# Patient Record
Sex: Male | Born: 1990 | Race: Black or African American | Hispanic: No | Marital: Single | Smoking: Never smoker
Health system: Southern US, Community
[De-identification: ages and names within clinical notes are randomized; demographics above are authoritative.]

## PROBLEM LIST (undated history)

## (undated) DIAGNOSIS — T7840XA Allergy, unspecified, initial encounter: Secondary | ICD-10-CM

## (undated) HISTORY — DX: Allergy, unspecified, initial encounter: T78.40XA

## (undated) HISTORY — PX: FRACTURE SURGERY: SHX138

## (undated) HISTORY — PX: HERNIA REPAIR: SHX51

---
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
## 2009-05-23 ENCOUNTER — Ambulatory Visit (HOSPITAL_BASED_OUTPATIENT_CLINIC_OR_DEPARTMENT_OTHER): Admission: RE | Admit: 2009-05-23 | Discharge: 2009-05-23 | Payer: Self-pay | Admitting: Orthopedic Surgery

## 2010-03-23 LAB — POCT HEMOGLOBIN-HEMACUE: Hemoglobin: 15.1 g/dL (ref 13.0–17.0)

## 2010-04-06 LAB — POCT HEMOGLOBIN-HEMACUE: Hemoglobin: 14.7 g/dL (ref 13.0–17.0)

## 2010-05-22 NOTE — Op Note (Signed)
NAMEHUGO, LYBRAND NO.:  [id number]   MEDICAL RECORD NO.:  [id number]          PATIENT TYPE:  AMB   LOCATION:  DSC                          FACILITY:  [facility]   PHYSICIAN:  Hermelinda Medicus, M.D.   DATE OF BIRTH:  [birth year]   DATE OF PROCEDURE:  07/21/2004  DATE OF DISCHARGE:                                 OPERATIVE REPORT   PREOPERATIVE DIAGNOSIS:  Nasal fracture playing basketball of 4 days  duration with septal and nasal deviation to the right.   POSTOPERATIVE DIAGNOSIS:  Nasal fracture playing basketball of 4 days  duration with septal and nasal deviation to the right.   OPERATION:  Closed reduction of nasal fracture.   ANESTHESIA:  General endotracheal anesthesia.   SURGEON:  Dr. Hermelinda Medicus.   ANESTHESIOLOGIST:  Dr. Laural Benes.   DESCRIPTION OF PROCEDURE:  The patient was placed in the supine position.  Under general endotracheal anesthesia, was prepped and draped in the  appropriate manner using the usual Hibiclens and the usual head drape.  Topical cocaine was used to anesthetize the nose and shrink the membranes.  The nasal bones were then brought back to the midline using the Ash forceps  and external pressure, and then the septum was brought back to the midline  as well using the Ash forceps. Once this was completed, a Merocel pack was  placed in the right side and a nasal cast was placed externally. The patient  tolerated the procedure well and is doing well postoperatively. The  patient's father is a Psychologist, occupational of their basketball team and he apparently is  going [place] to try to play basketball in the future weeks and he is  advised that he should not  expose him to playing basketball for the next 6  weeks because of the trauma to, and the weakness of his nose. However, they  wish to place, so he will play with a nose guard. The patient will return  tomorrow for removal of the packing and then may have the cast removed on  Friday. His  further followup will be in two weeks, four weeks, and then six  weeks.       JC/MEDQ  D:  07/21/2004  T:  07/21/2004  Job:  [id number]   cc:   Theador Hawthorne, M.D.  [phone]  [place]  [place] [address]  Fax: [phone]

## 2010-05-22 NOTE — H&P (Signed)
[name], [name] NO.:  [id number]   MEDICAL RECORD NO.:  [id number]          PATIENT TYPE:  AMB   LOCATION:  DSC                          FACILITY:  [facility]   PHYSICIAN:  Hermelinda Medicus, M.D.   DATE OF BIRTH:  20-Feb-1990   DATE OF ADMISSION:  07/21/2004  DATE OF DISCHARGE:                                HISTORY & PHYSICAL   This patient is a 20 year old male who was playing basketball and suffered a  fractured nose when hit with an elbow.  He has a deviation of his nose as  well as his septum and now enters in the fourth day post trauma for closed  reduction, nasal fracture.   His past history is just taking Allegra D for allergies.  He also has had a  penny removed from his esophagus apparently at age 52, and otherwise he is in  excellent condition.  He does wear contacts and glasses, and he has no other  symptoms or past problems.   He is allergic to SULFA, rash, and LIDOCAINE apparently caused a slight rash  also.  He is lactose-intolerant.   PHYSICAL EXAMINATION:  VITAL SIGNS:  Blood pressure of 110/75, pulse of 50,  weight 135, temperature 96.8.  HEENT:  Ears are clear.  Tympanic membranes are clear.  Nose is obviously  pushed over to the right, and the septum is pushed over to the right as  well.  The nasal airway is blocked on that right side.  Oral cavity is  clear.  Larynx is clear.  True cords, false cords, epiglottis, base of  tongue are clear of ulceration or mass.  True cord mobility, gag reflex,  ___________, facial nerve are all symmetrical.  NECK:  Free of any thyromegaly, cervical adenopathy or mass.  CHEST:  Clear.  No rales, rhonchi or wheezes.  CARDIOVASCULAR:  No ____________, snaps, murmurs, or gallops.  ABDOMEN:  Unremarkable.  EXTREMITIES:  Unremarkable.   INITIAL DIAGNOSIS:  Nasal fracture with deviation.       JC/MEDQ  D:  07/21/2004  T:  07/21/2004  Job:  [id number]   cc:   Theador Hawthorne, M.D.  [phone]   [place]  [place] [address]  Fax: [phone]

## 2010-09-15 ENCOUNTER — Emergency Department (HOSPITAL_COMMUNITY)
Admission: EM | Admit: 2010-09-15 | Discharge: 2010-09-15 | Disposition: A | Discharge status: Home / Self Care | Payer: 59 | Attending: Emergency Medicine | Admitting: Emergency Medicine

## 2010-09-15 DIAGNOSIS — R05 Cough: Secondary | ICD-10-CM | POA: Insufficient documentation

## 2010-09-15 DIAGNOSIS — R059 Cough, unspecified: Secondary | ICD-10-CM | POA: Insufficient documentation

## 2010-09-15 DIAGNOSIS — R0602 Shortness of breath: Secondary | ICD-10-CM | POA: Insufficient documentation

## 2010-09-15 DIAGNOSIS — J45909 Unspecified asthma, uncomplicated: Secondary | ICD-10-CM | POA: Insufficient documentation

## 2010-09-15 DIAGNOSIS — J3489 Other specified disorders of nose and nasal sinuses: Secondary | ICD-10-CM | POA: Insufficient documentation

## 2013-05-26 ENCOUNTER — Ambulatory Visit: Payer: 59

## 2013-05-26 ENCOUNTER — Ambulatory Visit: Payer: 59 | Admitting: Emergency Medicine

## 2013-05-26 VITALS — BP 124/78 | HR 73 | Temp 97.5°F | Resp 16 | Ht 72.0 in | Wt 184.0 lb

## 2013-05-26 DIAGNOSIS — S92009A Unspecified fracture of unspecified calcaneus, initial encounter for closed fracture: Secondary | ICD-10-CM

## 2013-05-26 DIAGNOSIS — M79673 Pain in unspecified foot: Secondary | ICD-10-CM

## 2013-05-26 DIAGNOSIS — M79609 Pain in unspecified limb: Secondary | ICD-10-CM

## 2013-05-26 MED ORDER — ACETAMINOPHEN-CODEINE #3 300-30 MG PO TABS: 1.0000 | ORAL_TABLET | ORAL | Status: AC | PRN

## 2013-05-26 NOTE — Progress Notes (Addendum)
[facility] [address] [place] [address] [phone]  Date:  05/26/2013   Name:  Phillip Hahn   DOB:  10/24/1990   MRN:  [id number]  PCP:  Katy Apo, MD    Chief Complaint: Foot Injury   History of Present Illness:  Phillip Hahn is a 23 y.o. very pleasant male patient who presents with the following:  Injured when came down on left heel after jumping playing basketball.  Unable to bear weight.  No ankle pain.  No deformity or ecchymosis.  No improvement with over the counter medications or other home remedies. Denies other complaint or health concern today.   There are no active problems to display for this patient.   Past Medical History  Diagnosis Date  . Allergy     Past Surgical History  Procedure Laterality Date  . Fracture surgery    . Hernia repair      History  Substance Use Topics  . Smoking status: Never Smoker   . Smokeless tobacco: Not on file  . Alcohol Use: No    History reviewed. No pertinent family history.  Allergies  Allergen Reactions  . Lidocaine     UNKNOWN  . Sulfa Antibiotics Hives    Medication list has been reviewed and updated.  No current outpatient prescriptions on file prior to visit.   No current facility-administered medications on file prior to visit.    Review of Systems:  As per HPI, otherwise negative.    Physical Examination: Filed Vitals:   05/26/13 1444  BP: 124/78  Pulse: 73  Temp: 97.5 F (36.4 C)  Resp: 16   Filed Vitals:   05/26/13 1444  Height: 6' (1.829 m)  Weight: 184 lb (83.462 kg)   Body mass index is 24.95 kg/(m^2). Ideal Body Weight: Weight in (lb) to have BMI = 25: 183.9   GEN: WDWN, NAD, Non-toxic, Alert & Oriented x 3 HEENT: Atraumatic, Normocephalic.  Ears and Nose: No external deformity. EXTR: No clubbing/cyanosis/edema NEURO: antalgic gait.  PSYCH: Normally interactive. Conversant. Not depressed or anxious appearing.  Calm demeanor.  LEFT  foot:  Marked tenderness in left heel. No deformity or ecchymosis  Assessment and Plan: Fracture calcaneus Crutches Boot tyl #3 Ortho  Signed,  Phillip Odor, MD   [facility] reading (PRIMARY) by  Dr. Dareen Piano.  Fracture calcaneus .  After being fitted with a boot and crutches, he consulted his mother by phone who advised he needed neither.

## 2016-03-11 IMAGING — CR DG OS CALCIS 2+V*L*
2 series · 2 of 2 positions shown · non-contrast
Comparison: None.

CLINICAL DATA: Basketball injury.  Pain

EXAM:
LEFT OS CALCIS - 2+ VIEW

[axial]
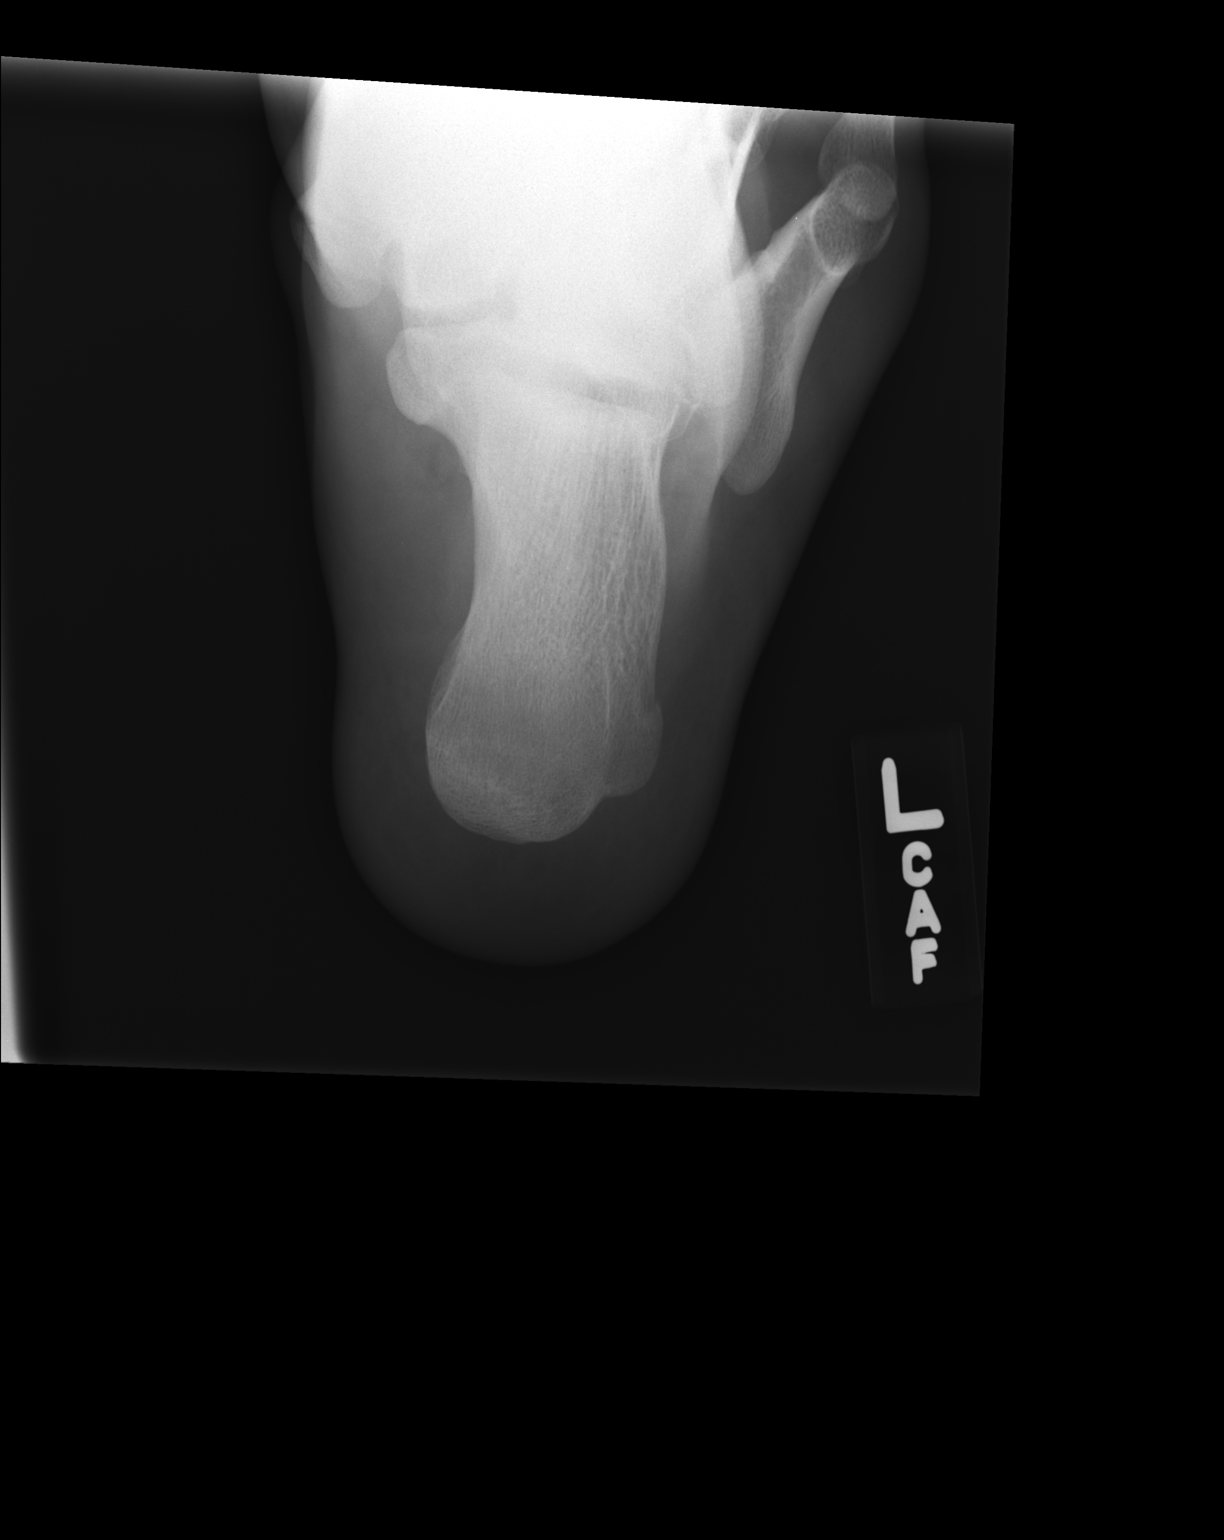

[lateral]
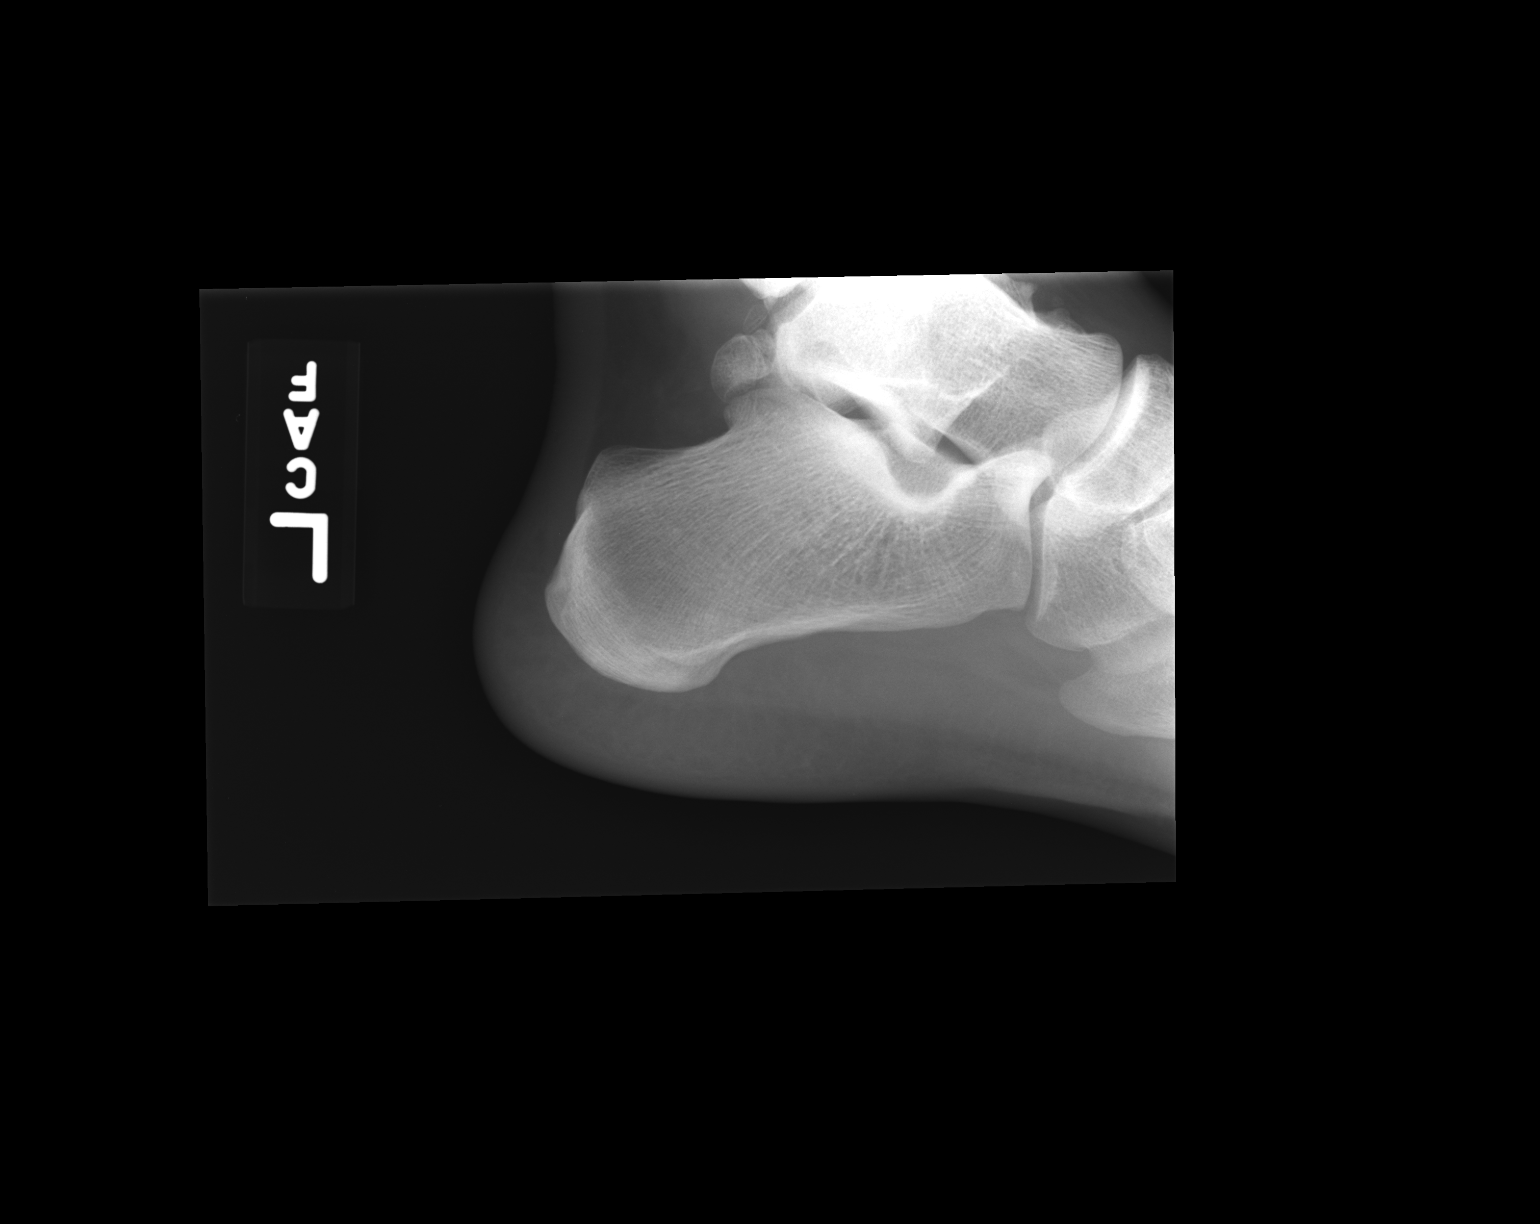

[2 of 2 positions shown; findings below may reference images not displayed]

FINDINGS: There is no evidence of fracture or other focal bone lesions. Soft
tissues are unremarkable.
IMPRESSION: Negative
